# Patient Record
Sex: Male | Born: 1985 | Race: Black or African American | Hispanic: No | Smoking: Current some day smoker
Health system: Southern US, Community
[De-identification: ages and names within clinical notes are randomized; demographics above are authoritative.]

---
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
## 2011-12-30 ENCOUNTER — Emergency Department (HOSPITAL_COMMUNITY)
Admission: EM | Admit: 2011-12-30 | Discharge: 2011-12-30 | Disposition: A | Discharge status: Home / Self Care | Payer: 59 | Attending: Emergency Medicine | Admitting: Emergency Medicine

## 2011-12-30 DIAGNOSIS — R1310 Dysphagia, unspecified: Secondary | ICD-10-CM | POA: Insufficient documentation

## 2011-12-30 DIAGNOSIS — R109 Unspecified abdominal pain: Secondary | ICD-10-CM | POA: Insufficient documentation

## 2011-12-30 DIAGNOSIS — R630 Anorexia: Secondary | ICD-10-CM | POA: Insufficient documentation

## 2011-12-30 DIAGNOSIS — J029 Acute pharyngitis, unspecified: Secondary | ICD-10-CM | POA: Insufficient documentation

## 2011-12-30 LAB — RAPID STREP SCREEN (MED CTR MEBANE ONLY): Streptococcus, Group A Screen (Direct): NEGATIVE

## 2011-12-30 LAB — MONONUCLEOSIS SCREEN: Mono Screen: NEGATIVE

## 2011-12-30 MED ORDER — IBUPROFEN 800 MG PO TABS
800.0000 mg | ORAL_TABLET | Freq: Once | ORAL | Status: AC
  Administered 2011-12-30: 800 mg via ORAL
  Filled 2011-12-30: qty 1

## 2011-12-30 MED ORDER — PREDNISONE 50 MG PO TABS: 50.0000 mg | ORAL_TABLET | Freq: Every day | ORAL | Status: DC

## 2011-12-30 MED ORDER — ACETAMINOPHEN-CODEINE 120-12 MG/5ML PO SOLN: 10.0000 mL | ORAL | Status: DC | PRN

## 2011-12-30 NOTE — ED Provider Notes (Signed)
Medical screening examination/treatment/procedure(s) were performed by non-physician practitioner and as supervising physician I was immediately available for consultation/collaboration.  Benito Lemmerman, MD 12/30/11 2317 

## 2011-12-30 NOTE — ED Notes (Signed)
Pt presented to ED with soar throat since this morning from 1am.

## 2011-12-30 NOTE — ED Provider Notes (Signed)
History     CSN: [id number]  Arrival date & time 12/30/11  4098   First MD Initiated Contact with Patient 12/30/11 [phone]      Chief Complaint  Patient presents with  . Sore Throat    (Consider location/radiation/quality/duration/timing/severity/associated sxs/prior treatment) HPI The patient presents with a 3 day history of sore throat that increased in intensity around 1 AM.  He reports increased pain while swallowing today upon laying down and the sensation of something caught in his throat.  He states he drank a lot of water had belched a few times and had two episodes of vomiting, last episode 0430 today.  He denies fever or chills, dyspnea, wheezing, lip swelling, constipation, hoarseness, diarrhea, or headache.  Reports decrease in appetite over the past few days.  Reports mild abdominal pain post emisis.  He states he has "been fighting a sore throat for a few days".   No past medical history on file.  No past surgical history on file.  No family history on file.  History  Substance Use Topics  . Smoking status: Not on file  . Smokeless tobacco: Not on file  . Alcohol Use: Not on file      Review of Systems  Allergies  Review of patient's allergies indicates not on file.  Home Medications  No current outpatient prescriptions on file.  BP 128/58  Pulse 67  Temp 97.8 F (36.6 C) (Oral)  Resp 18  SpO2 100%  Physical Exam  Nursing note and vitals reviewed. Constitutional: He appears well-developed and well-nourished.  HENT:  Head: Normocephalic and atraumatic.  Right Ear: Tympanic membrane normal. Tympanic membrane is not erythematous and not bulging.  Left Ear: Tympanic membrane normal. Tympanic membrane is not erythematous and not bulging.  Nose: Rhinorrhea present.  Mouth/Throat: Uvula is midline. Mucous membranes are not pale and not dry. Posterior oropharyngeal erythema present. No oropharyngeal exudate.       3+ tonsils  Neck: Neck supple.     Cardiovascular: Normal rate, regular rhythm, S1 normal, S2 normal and normal heart sounds.   Pulmonary/Chest: Effort normal and breath sounds normal. He has no decreased breath sounds. He has no wheezes. He has no rhonchi. He has no rales.  Abdominal: Soft. There is no tenderness.  Lymphadenopathy:    He has cervical adenopathy.       Right cervical: Posterior cervical adenopathy present.       Left cervical: Posterior cervical adenopathy present.  Neurological: He is alert.    ED Course  Procedures (including critical care time)   Labs Reviewed  RAPID STREP SCREEN  MONONUCLEOSIS SCREEN   [phone] The patient reports some relief with pain medication.  He reports decrease in pain with drinking water and is able to tolerate oral secretions.  Discussed test results and treatment plan.  MDM  The patient presents with a several day history of sore throat with an increase this morning.  He denies fever, chills, or recent exposure to shellfish.  On exam there was oropharyngeal erythremia and lymphadenopathy.  Rapid Strep negative and Mono-stat negative.  Partial relief with Ibuprofin 800 mg.  He reports able to handle secretions and liquids.          Carlyle Dolly, PA-C 12/30/11 [phone]

## 2013-05-31 ENCOUNTER — Ambulatory Visit: Payer: BC Managed Care – PPO

## 2013-05-31 ENCOUNTER — Ambulatory Visit (INDEPENDENT_AMBULATORY_CARE_PROVIDER_SITE_OTHER): Payer: BC Managed Care – PPO | Admitting: Internal Medicine

## 2013-05-31 VITALS — BP 122/68 | HR 77 | Temp 98.0°F | Resp 16 | Wt 224.4 lb

## 2013-05-31 DIAGNOSIS — M79644 Pain in right finger(s): Secondary | ICD-10-CM

## 2013-05-31 DIAGNOSIS — M79609 Pain in unspecified limb: Secondary | ICD-10-CM
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
## 2013-06-13 ENCOUNTER — Ambulatory Visit (INDEPENDENT_AMBULATORY_CARE_PROVIDER_SITE_OTHER): Payer: BC Managed Care – PPO | Admitting: Family Medicine

## 2013-06-13 VITALS — BP 122/82 | HR 53 | Temp 98.5°F | Resp 16 | Ht 72.0 in | Wt 229.2 lb

## 2013-06-13 DIAGNOSIS — S56911A Strain of unspecified muscles, fascia and tendons at forearm level, right arm, initial encounter: Secondary | ICD-10-CM

## 2013-06-13 DIAGNOSIS — S46911A Strain of unspecified muscle, fascia and tendon at shoulder and upper arm level, right arm, initial encounter: Secondary | ICD-10-CM

## 2013-06-13 DIAGNOSIS — M79609 Pain in unspecified limb: Secondary | ICD-10-CM

## 2013-06-13 DIAGNOSIS — IMO0002 Reserved for concepts with insufficient information to code with codable children: Secondary | ICD-10-CM

## 2013-06-13 DIAGNOSIS — M79601 Pain in right arm: Secondary | ICD-10-CM

## 2013-06-13 MED ORDER — DICLOFENAC SODIUM 75 MG PO TBEC: 75.0000 mg | DELAYED_RELEASE_TABLET | Freq: Two times a day (BID) | ORAL | Status: DC

## 2013-06-13 NOTE — Patient Instructions (Signed)
Take voltaren 75 one twice daily with food.  Stay out of baseball for 1 week to rest arm  Gentle stretching, advance as tolerated  If not improving call back so we can make physical therapy referral

## 2013-06-13 NOTE — Progress Notes (Signed)
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 He plays out in the field. A week ago he had a lot of bruising and from the filament. That evening dosing Cardiolite his Augmentin is continued. He hurts in the right upper arm triceps region and in life your arm ulnar aspect below the elbow.  Objective: Good grip. Pain with pronation and supination in the midline below the right elbow ulnar aspect. The biceps are not tender but the right triceps are tender it is difficult for him to do a throwing motion of motion due to the pain. No tenderness in the shoulder. Range of motion a little bit.  Assessment: Overuse strain of right upper and right lower arm.  Plan: No baseball for a week. Gradually progress activity. If symptoms continue to persist about Thursday he is to call back and we'll make a referral for physical therapy. Take anti-inflammatory medication. Apply ice.

## 2013-08-16 ENCOUNTER — Emergency Department (INDEPENDENT_AMBULATORY_CARE_PROVIDER_SITE_OTHER): Payer: BC Managed Care – PPO

## 2013-08-16 ENCOUNTER — Emergency Department (INDEPENDENT_AMBULATORY_CARE_PROVIDER_SITE_OTHER)
Admission: EM | Admit: 2013-08-16 | Discharge: 2013-08-16 | Disposition: A | Discharge status: Home / Self Care | Payer: BC Managed Care – PPO | Source: Home / Self Care | Attending: Family Medicine | Admitting: Family Medicine

## 2013-08-16 ENCOUNTER — Encounter (HOSPITAL_COMMUNITY): Payer: Self-pay | Admitting: Emergency Medicine

## 2013-08-16 ENCOUNTER — Emergency Department (HOSPITAL_COMMUNITY): Payer: BC Managed Care – PPO

## 2013-08-16 DIAGNOSIS — S40019A Contusion of unspecified shoulder, initial encounter: Secondary | ICD-10-CM

## 2013-08-16 DIAGNOSIS — S40012A Contusion of left shoulder, initial encounter: Secondary | ICD-10-CM

## 2013-08-16 MED ORDER — NAPROXEN 500 MG PO TABS: 500.0000 mg | ORAL_TABLET | Freq: Two times a day (BID) | ORAL | Status: DC

## 2013-08-16 MED ORDER — KETOROLAC TROMETHAMINE 30 MG/ML IJ SOLN
30.0000 mg | Freq: Once | INTRAMUSCULAR | Status: AC
  Administered 2013-08-16: 30 mg via INTRAMUSCULAR

## 2013-08-16 MED ORDER — KETOROLAC TROMETHAMINE 30 MG/ML IJ SOLN
INTRAMUSCULAR | Status: AC
  Filled 2013-08-16: qty 1

## 2013-08-16 NOTE — ED Provider Notes (Signed)
CSN: [id number]     Arrival date & time 08/16/13  1807 History   First MD Initiated Contact with Patient 08/16/13 1817     Chief Complaint  Patient presents with  . Shoulder Injury   (Consider location/radiation/quality/duration/timing/severity/associated sxs/prior Treatment) HPI Comments: Reports he injured his left shoulder when sliding into home plate during baseball game PTA.   Patient is a 28 y.o. male presenting with shoulder injury. The history is provided by the patient.  Shoulder Injury This is a new problem. The current episode started 1 to 2 hours ago. The problem occurs constantly. The problem has not changed since onset.Associated symptoms comments: none.    History reviewed. No pertinent past medical history. History reviewed. No pertinent past surgical history. History reviewed. No pertinent family history. History  Substance Use Topics  . Smoking status: Current Some Day Smoker    Types: Cigars  . Smokeless tobacco: Never Used  . Alcohol Use: 7.2 oz/week    12 Shots of liquor per week    Review of Systems  All other systems reviewed and are negative.   Allergies  Shellfish allergy  Home Medications   Prior to Admission medications   Medication Sig Start Date End Date Taking? Authorizing Provider  diclofenac (VOLTAREN) 75 MG EC tablet Take 1 tablet (75 mg total) by mouth 2 (two) times daily. 06/13/13   Peyton Najjaravid H Hopper, MD  naproxen (NAPROSYN) 500 MG tablet Take 1 tablet (500 mg total) by mouth 2 (two) times daily with a meal. As needed for pain 08/16/13   Jess BartersJennifer Lee Calley Drenning, PA   BP 149/76  Pulse 75  Temp(Src) 98.5 F (36.9 C) (Oral)  Resp 16 Physical Exam  Nursing note and vitals reviewed. Constitutional: He is oriented to person, place, and time. He appears well-developed and well-nourished. No distress.  HENT:  Head: Normocephalic and atraumatic.  Cardiovascular: Normal rate, regular rhythm and normal heart sounds.   Pulmonary/Chest: Effort normal  and breath sounds normal.  Musculoskeletal:       Left shoulder: He exhibits decreased range of motion and tenderness. He exhibits no bony tenderness, no swelling, no effusion, no crepitus, no deformity, no laceration, no spasm, normal pulse and normal strength.  ROM only limited by discomfort. No deformity. CSM exam of LUE normal and intact.   Neurological: He is alert and oriented to person, place, and time.  Skin: Skin is warm and dry.  +intact  Psychiatric: He has a normal mood and affect. His behavior is normal.    ED Course  Procedures (including critical care time) Labs Review Labs Reviewed - No data to display  Imaging Review Dg Clavicle Left  08/16/2013   CLINICAL DATA:  Traumatic injury and pain  EXAM: LEFT CLAVICLE - 2+ VIEWS  COMPARISON:  None.  FINDINGS: There is no evidence of fracture or other focal bone lesions. Soft tissues are unremarkable.  IMPRESSION: No acute abnormality is noted.   Electronically Signed   By: Alcide CleverMark  Lukens M.D.   On: 08/16/2013 18:44   Dg Shoulder Left  08/16/2013   CLINICAL DATA:  Left shoulder pain following baseball injury  EXAM: LEFT SHOULDER - 2+ VIEW  COMPARISON:  None.  FINDINGS: There is no evidence of fracture or dislocation. There is no evidence of arthropathy or other focal bone abnormality. Soft tissues are unremarkable.  IMPRESSION: No acute abnormality is noted.   Electronically Signed   By: Alcide CleverMark  Lukens M.D.   On: 08/16/2013 18:43     MDM  1. Contusion of left shoulder, initial encounter    Contusion of left shoulder. Sling as needed for comfort. Ice and naprosyn as directed for pain. Advised not to wear sling for long periods of time and to perform gentle range of motion exercises of left shoulder several times a day. No sports x 1-2 weeks. If no improvement over next 1-2 weeks, advised to contact Dr. Lajoyce Corners or orthopedist of choice for follow up evaluation.     Jess Barters Ham[place], [place] 08/16/13 [phone]

## 2013-08-16 NOTE — Discharge Instructions (Signed)
Your xrays were without evidence of fracture or dislocation. You may wear the sling as needed for comfort, but do not wear for long periods of time. Must remove and perform gentle range of motion of the shoulder several times a day. No sports for 1-2 weeks. Ice and naprosyn as prescribed for pain and swelling. If no improvement over the next 1-2 weeks, please contact the orthopedist listed on your discharge paperwork or the orthopedist of your choice for follow up evaluation.   Contusion A contusion is a deep bruise. Contusions are the result of an injury that caused bleeding under the skin. The contusion may turn blue, purple, or yellow. Minor injuries will give you a painless contusion, but more severe contusions may stay painful and swollen for a few weeks.  CAUSES  A contusion is usually caused by a blow, trauma, or direct force to an area of the body. SYMPTOMS   Swelling and redness of the injured area.  Bruising of the injured area.  Tenderness and soreness of the injured area.  Pain. DIAGNOSIS  The diagnosis can be made by taking a history and physical exam. An X-ray, CT scan, or MRI may be needed to determine if there were any associated injuries, such as fractures. TREATMENT  Specific treatment will depend on what area of the body was injured. In general, the best treatment for a contusion is resting, icing, elevating, and applying cold compresses to the injured area. Over-the-counter medicines may also be recommended for pain control. Ask your caregiver what the best treatment is for your contusion. HOME CARE INSTRUCTIONS   Put ice on the injured area.  Put ice in a plastic bag.  Place a towel between your skin and the bag.  Leave the ice on for 15-20 minutes, 3-4 times a day, or as directed by your health care provider.  Only take over-the-counter or prescription medicines for pain, discomfort, or fever as directed by your caregiver. Your caregiver may recommend avoiding  anti-inflammatory medicines (aspirin, ibuprofen, and naproxen) for 48 hours because these medicines may increase bruising.  Rest the injured area.  If possible, elevate the injured area to reduce swelling. SEEK IMMEDIATE MEDICAL CARE IF:   You have increased bruising or swelling.  You have pain that is getting worse.  Your swelling or pain is not relieved with medicines. MAKE SURE YOU:   Understand these instructions.  Will watch your condition.  Will get help right away if you are not doing well or get worse. Document Released: 10/11/2004 Document Revised: 01/06/2013 Document Reviewed: 11/06/2010 [facility] Patient Information 2015 [place]. This information is not intended to replace advice given to you by your health care provider. Make sure you discuss any questions you have with your health care provider.

## 2013-08-16 NOTE — ED Provider Notes (Signed)
Medical screening examination/treatment/procedure(s) were performed by resident physician or non-physician practitioner and as supervising physician I was immediately available for consultation/collaboration.   Tiffany Talarico DOUGLAS MD.   Justino Boze D Dhruvi Crenshaw, MD 08/16/13 1930 

## 2013-08-16 NOTE — ED Notes (Addendum)
Playing baseball @ 1720.  He slid into home plate and the catcher fell on him. Pt. Injured L shoulder.  Pt. said he felt it pop back in.  C/o tightness and soreness to same.

## 2020-06-20 ENCOUNTER — Ambulatory Visit: Payer: Self-pay | Admitting: Family Medicine

## 2020-09-09 ENCOUNTER — Ambulatory Visit (INDEPENDENT_AMBULATORY_CARE_PROVIDER_SITE_OTHER): Payer: 59

## 2020-09-09 ENCOUNTER — Ambulatory Visit: Payer: 59 | Admitting: Family Medicine

## 2020-09-09 ENCOUNTER — Other Ambulatory Visit: Payer: Self-pay

## 2020-09-09 ENCOUNTER — Encounter: Payer: Self-pay | Admitting: Family Medicine

## 2020-09-09 VITALS — BP 114/60 | HR 65 | Temp 97.7°F | Ht 70.0 in | Wt 220.2 lb

## 2020-09-09 DIAGNOSIS — M5417 Radiculopathy, lumbosacral region: Secondary | ICD-10-CM

## 2020-09-09 DIAGNOSIS — Z8709 Personal history of other diseases of the respiratory system: Secondary | ICD-10-CM | POA: Insufficient documentation

## 2020-09-09 DIAGNOSIS — M79645 Pain in left finger(s): Secondary | ICD-10-CM
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
## 2020-09-15 ENCOUNTER — Other Ambulatory Visit: Payer: Self-pay

## 2020-09-15 ENCOUNTER — Ambulatory Visit (INDEPENDENT_AMBULATORY_CARE_PROVIDER_SITE_OTHER): Payer: 59

## 2020-09-15 DIAGNOSIS — Z23 Encounter for immunization: Secondary | ICD-10-CM

## 2020-09-15 NOTE — Progress Notes (Signed)
After obtaining consent, and per orders of Dr. Veto Kemps, injection of TD given by [place]. Patient instructed to remain in clinic for 20 minutes afterwards, and to report any adverse reaction to me immediately.

## 2020-09-22 ENCOUNTER — Telehealth: Payer: Self-pay | Admitting: Family Medicine

## 2020-09-22 NOTE — Telephone Encounter (Signed)
Spoke to [facility] @ [facility], patient has an appointment for MRI on Sunday 09/25/20 and this needs to have a PA done. Can you pease check on this due to time of appt.  They will need to know if its approved by Friday afternoon.   Thanks.  Dm/cma

## 2020-09-22 NOTE — Telephone Encounter (Signed)
Called LaTonya back and advised that insurance is denying coverage and requesting a peer to peer.  She will call patient to advise. Dm/cma

## 2020-09-22 NOTE — Telephone Encounter (Signed)
[facility] is needing information concerning pt's MR Lumbar Spine Wo Contrast (Order [id number]). Please advise at [phone] ext [phone].

## 2020-09-25 ENCOUNTER — Other Ambulatory Visit: Payer: 59

## 2020-09-27 ENCOUNTER — Telehealth: Payer: Self-pay | Admitting: Family Medicine

## 2020-09-27 NOTE — Telephone Encounter (Signed)
Pt is wanting to reschedule his MRI which was cancelled due to insurance issues. Please give him a call back, he is in extreme pain. [phone]

## 2020-09-27 NOTE — Telephone Encounter (Signed)
In order for insurance to cover the MRI he needs a peer to peer done.  Dm/cma

## 2020-09-28 ENCOUNTER — Telehealth: Payer: Self-pay | Admitting: Family Medicine

## 2020-09-28 NOTE — Telephone Encounter (Signed)
Bradley Fowler is wanting to know if the MRI has been rescheduled? His pain is getting worse. [phone] I told him you would call him back.

## 2020-09-29 NOTE — Telephone Encounter (Signed)
Spoke to patient and advised that his insurance denied the MRI and it needs a Peer to Peer.  He understands and states that since Sunday 09/25/20 the pain has gotten worse and is radiating form lower back into his legs. Pain with walking.   He will wait to hear back form [place].  Did you get a message that this was needed?  Please review and advise.  Thanks,  Ian Bushman, cma

## 2020-10-14 ENCOUNTER — Other Ambulatory Visit: Payer: Self-pay

## 2020-10-14 ENCOUNTER — Ambulatory Visit
Admission: RE | Admit: 2020-10-14 | Discharge: 2020-10-14 | Disposition: A | Discharge status: Home / Self Care | Payer: 59 | Source: Ambulatory Visit | Attending: Family Medicine | Admitting: Family Medicine

## 2020-10-14 DIAGNOSIS — M5417 Radiculopathy, lumbosacral region: Secondary | ICD-10-CM

## 2020-10-27 ENCOUNTER — Telehealth: Payer: Self-pay | Admitting: Family Medicine

## 2020-10-27 NOTE — Telephone Encounter (Signed)
Pt called to f/u on MRI results. He got msg in Mychart but never heard from provider.  Please call back at [phone]

## 2020-10-28 NOTE — Telephone Encounter (Signed)
Patient notified  and scheduled for 10/21/@2  pm. Dm/cma

## 2020-11-03 ENCOUNTER — Other Ambulatory Visit: Payer: Self-pay

## 2020-11-04 ENCOUNTER — Encounter: Payer: Self-pay | Admitting: Family Medicine

## 2020-11-04 ENCOUNTER — Ambulatory Visit: Payer: 59 | Admitting: Family Medicine

## 2020-11-04 VITALS — BP 136/78 | HR 76 | Temp 98.3°F | Ht 70.0 in | Wt 220.0 lb

## 2020-11-04 DIAGNOSIS — M5417 Radiculopathy, lumbosacral region: Secondary | ICD-10-CM | POA: Diagnosis not present

## 2020-11-04 MED ORDER — PREDNISONE 20 MG PO TABS: 20.0000 mg | ORAL_TABLET | Freq: Every day | ORAL | 0 refills | Status: AC
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
## 2020-11-22 ENCOUNTER — Telehealth: Payer: Self-pay | Admitting: Family Medicine

## 2020-11-22 NOTE — Telephone Encounter (Signed)
Spoke to patient, he is reporting increased pain now in both LT & RT lower abdomen. Is there anything he can do before his appointment with Neurology on 11/29/20.  Please review and advise.  Dm/cma

## 2020-11-23 NOTE — Telephone Encounter (Signed)
Patient notified VIA phone and will wait till after his appointment with Neurology and jus take OTC meds. Dm/cma

## 2021-02-20 ENCOUNTER — Ambulatory Visit (INDEPENDENT_AMBULATORY_CARE_PROVIDER_SITE_OTHER): Payer: 59 | Admitting: Physician Assistant

## 2021-02-20 ENCOUNTER — Encounter: Payer: Self-pay | Admitting: Physician Assistant

## 2021-02-20 ENCOUNTER — Other Ambulatory Visit: Payer: Self-pay

## 2021-02-20 DIAGNOSIS — M7061 Trochanteric bursitis, right hip: Secondary | ICD-10-CM

## 2021-02-20 DIAGNOSIS — M5410 Radiculopathy, site unspecified: Secondary | ICD-10-CM

## 2021-02-20 MED ORDER — METHYLPREDNISOLONE ACETATE 40 MG/ML IJ SUSP
40.0000 mg | INTRAMUSCULAR | Status: AC | PRN
  Administered 2021-02-20: 40 mg via INTRA_ARTICULAR

## 2021-02-20 MED ORDER — LIDOCAINE HCL 1 % IJ SOLN
3.0000 mL | INTRAMUSCULAR | Status: AC | PRN
  Administered 2021-02-20: 3 mL
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
## 2021-03-13 ENCOUNTER — Ambulatory Visit (INDEPENDENT_AMBULATORY_CARE_PROVIDER_SITE_OTHER): Payer: 59 | Admitting: Physician Assistant

## 2021-03-13 ENCOUNTER — Encounter: Payer: Self-pay | Admitting: Physician Assistant

## 2021-03-13 DIAGNOSIS — M5410 Radiculopathy, site unspecified: Secondary | ICD-10-CM | POA: Diagnosis not present

## 2021-03-13 DIAGNOSIS — M25559 Pain in unspecified hip: Secondary | ICD-10-CM | POA: Diagnosis not present

## 2021-03-13 NOTE — Progress Notes (Signed)
HPI: Bradley Fowler returns today for follow-up of his right hip.  He underwent a trochanteric injection of the right hip on 02/20/2021.  States that for about a week and a half he was 70% better.  He states is similar to when he had lumbar epidural steroid injection.  He has had no new injury.  He describes numbness in the right buttocks region with a sharp pain from his ASIS in the anterior aspect of the hip.  This does go down to the knee at times.  Is worse with walking standing sitting for prolonged periods. He does bring with him an MRI which shows some mild superior femoral head cartilage loss but no other irregularities.  Patient states that he is moving to [place] next week.  Review of systems: See HPI otherwise negative  Physical exam: General well-developed well-nourished male who ambulates without any assistive device and a nonantalgic gait.  Bilateral hips: Good range of motion of both hips subjective discomfort right hip with flexion and internal rotation.  Minimal tenderness over the right trochanteric region.  Impression: Radicular pain right lower extremity Right hip pain  Plan: Discussed with him after reviewing his MRI that did not see any findings requiring surgical intervention.  Recommend physical therapy for his hip and back.  They will include back exercises stretching the IT band and psoas, home exercise program, core strengthening and modalities.  He will find an orthopedic surgeon along with physical therapist in [place].  Questions were encouraged and answered at length today.

## 2022-03-02 ENCOUNTER — Telehealth: Payer: Self-pay | Admitting: Family Medicine

## 2022-03-02 NOTE — Telephone Encounter (Signed)
Pt was returning a call on his VM. Please call again.

## 2022-03-05 NOTE — Telephone Encounter (Signed)
Lft VM to rtn call. Dm/cma  

## 2022-03-08 NOTE — Telephone Encounter (Signed)
Called patient and advised him that I hadn't called him.  He has moved to [place] and is not longer living here.  No further questions. Dm/cma

## 2022-03-31 IMAGING — MR MR LUMBAR SPINE W/O CM
4 of 5 series · 18 of 48 positions shown · non-contrast
Comparison: Lumbar radiographs 09/09/2020

CLINICAL DATA: Lumbar radiculopathy. Pain and numbness right L1
distribution.

EXAM:
MRI LUMBAR SPINE WITHOUT CONTRAST
TECHNIQUE: Multiplanar, multisequence MR imaging of the lumbar spine was
performed. No intravenous contrast was administered.

[Series 6: T2 · sagittal · 4.0mm · 0.73mm/px · 5 of 15 slices shown (1 of 2)]
[im 1/15]
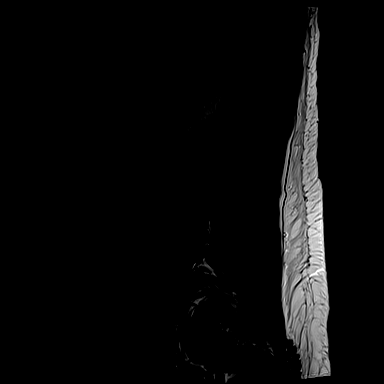
[im 4/15]
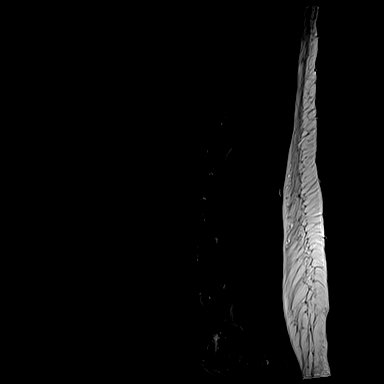
[im 8/15]
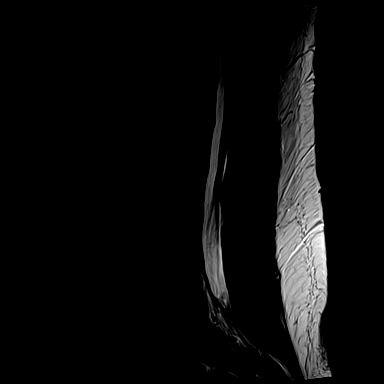
[im 11/15]
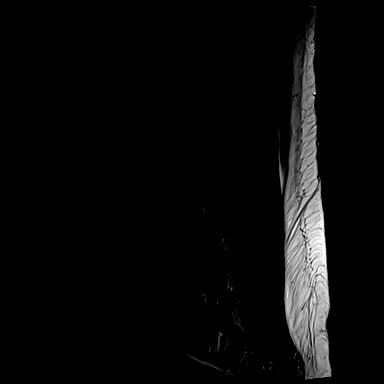
[im 15/15]
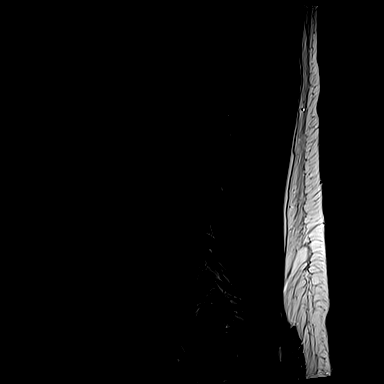

[Series 7: T1 · sagittal · 4.0mm · 0.73mm/px · 3 of 15 slices shown (1 of 2)]
[im 1/15]
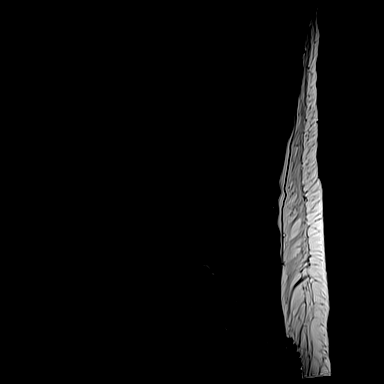
[im 8/15]
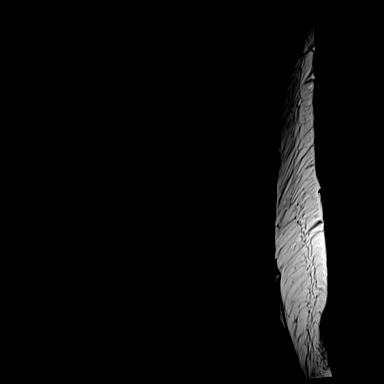
[im 15/15]
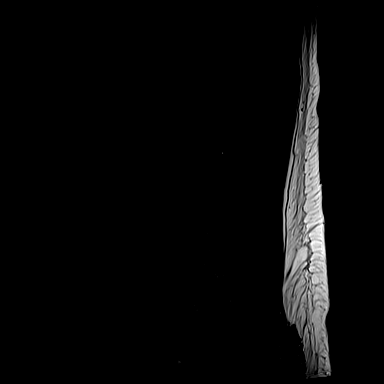

[Series 11: T1 · axial · 4.0mm · 0.28mm/px · z∈[-99,+59]mm · 3 of 42 slices shown (2 of 2)]
[im 6/42]
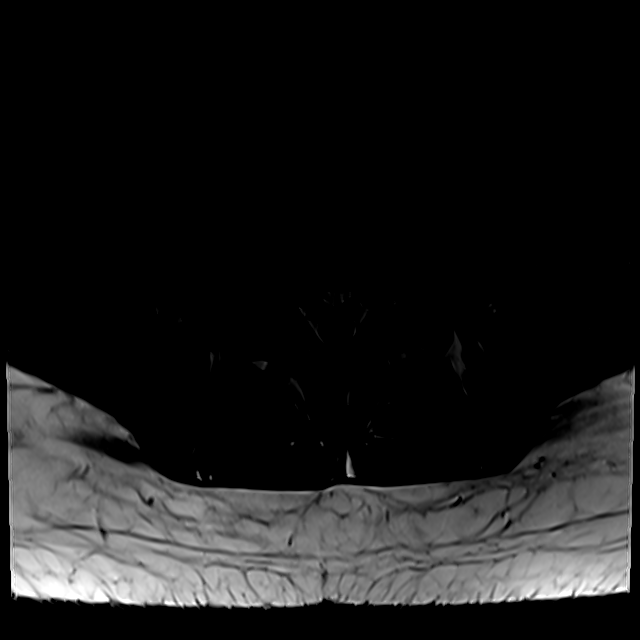
[im 22/42]
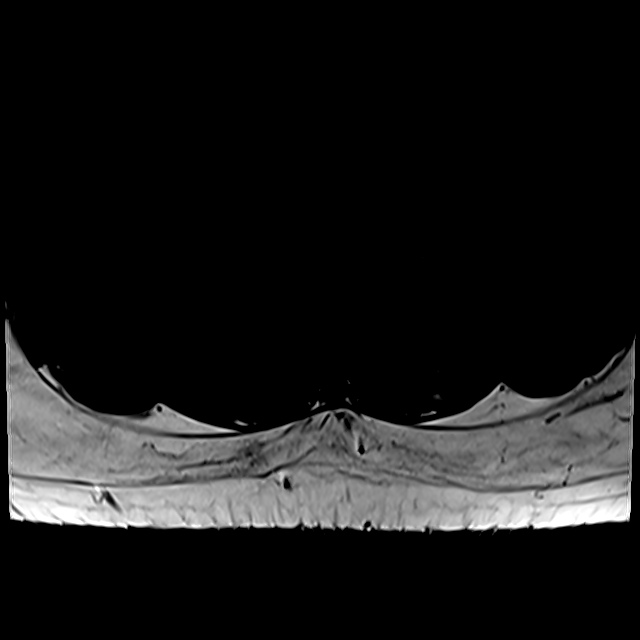
[im 36/42]
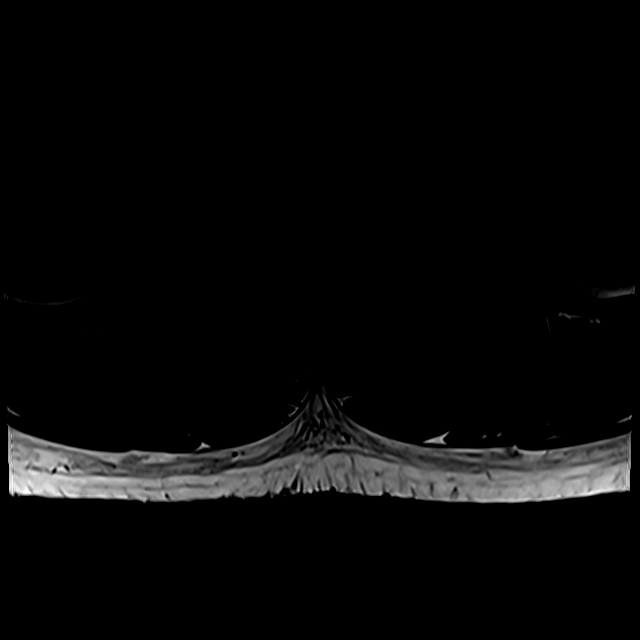

[Series 14: T2 · axial · 4.0mm · 0.28mm/px · z∈[-114,+59]mm · 7 of 42 slices shown (2 of 2)]
[im 3/42]
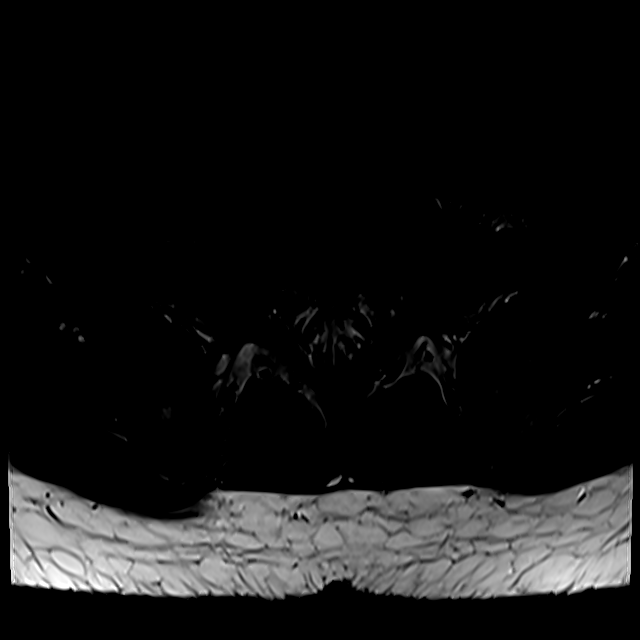
[im 6/42]
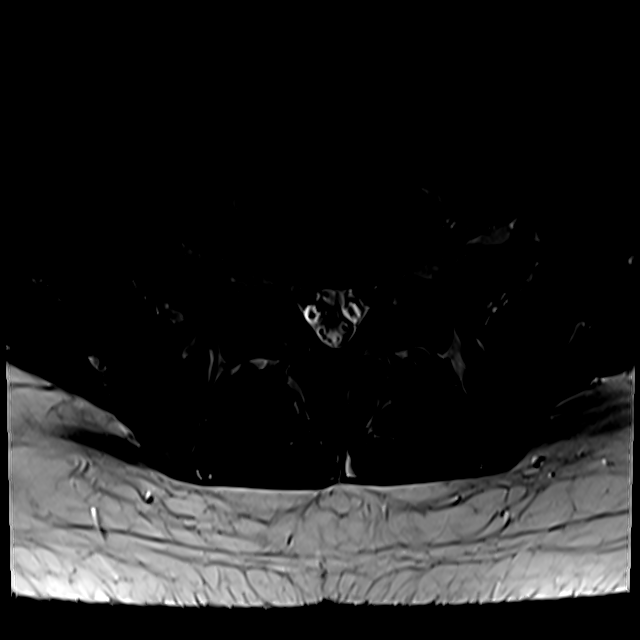
[im 9/42]
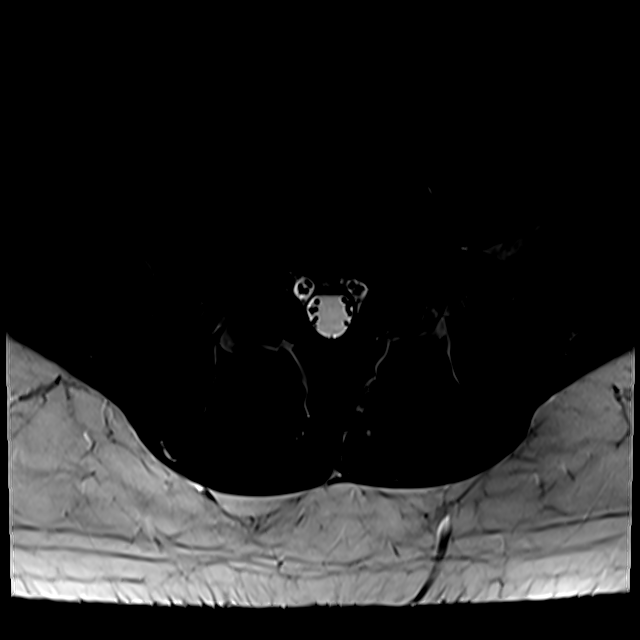
[im 14/42]
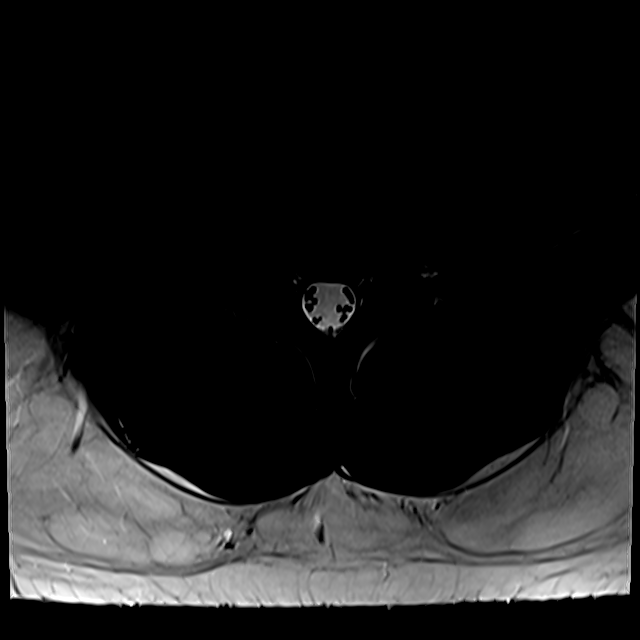
[im 20/42]
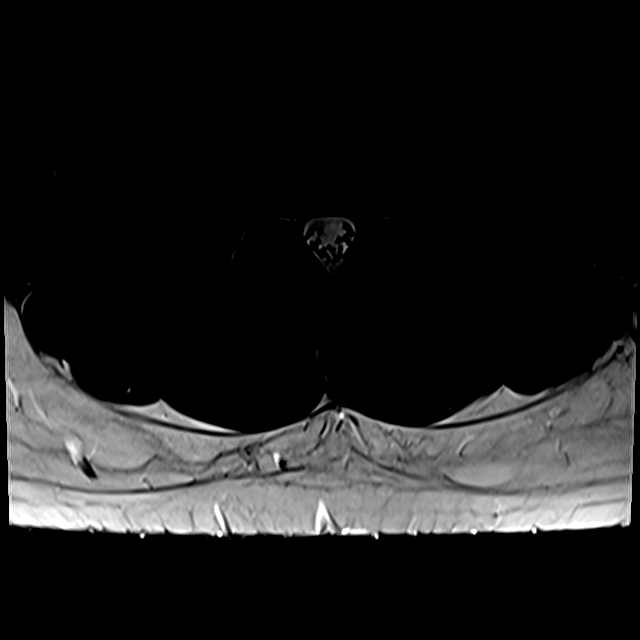
[im 22/42]
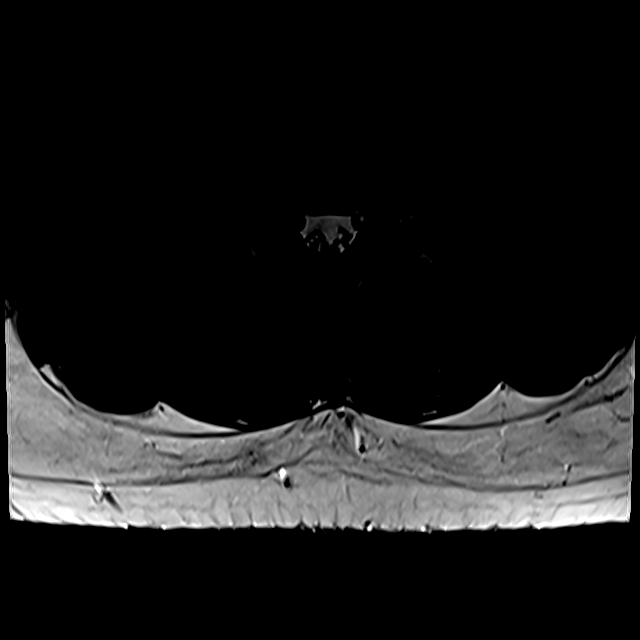
[im 36/42]
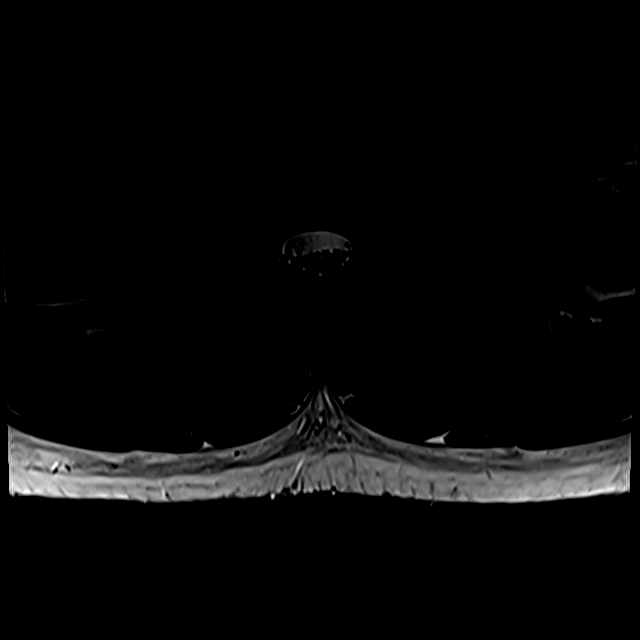

[18 of 48 positions shown; findings below may reference images not displayed]

FINDINGS: Segmentation:  Standard

Alignment:  Mild retrolisthesis L5-S1.

Vertebrae:  Normal bone marrow.  Negative for fracture or mass.

Conus medullaris and cauda equina: Conus extends to the L1 level.
Conus and cauda equina appear normal.

Paraspinal and other soft tissues: Negative for paraspinous mass,
adenopathy, or fluid collection

Disc levels:

L1-2: Negative

L2-3: Negative

L3-4: Negative

L4-5: Mild disc degeneration. Small central disc protrusion and mild
facet degeneration. Mild subarticular stenosis bilaterally which
could cause L5 nerve root symptoms.

L5-S1: Mild disc bulging.  Negative for neural impingement.
IMPRESSION: Mild disc and facet degeneration L4-5 with mild subarticular
stenosis bilaterally.

No cause for right L1 symptoms.
